# Patient Record
Sex: Male | Born: 1975 | Hispanic: Yes | Marital: Married | Smoking: Never smoker
Health system: Southern US, Community
[De-identification: ages and names within clinical notes are randomized; demographics above are authoritative.]

## PROBLEM LIST (undated history)

## (undated) DIAGNOSIS — F419 Anxiety disorder, unspecified: Secondary | ICD-10-CM

## (undated) DIAGNOSIS — J45909 Unspecified asthma, uncomplicated: Secondary | ICD-10-CM

---

## 2018-03-09 ENCOUNTER — Encounter (HOSPITAL_COMMUNITY)
Admission: EM | Disposition: A | Discharge status: Home / Self Care | Payer: Self-pay | Source: Home / Self Care | Attending: Emergency Medicine

## 2018-03-09 ENCOUNTER — Encounter (HOSPITAL_BASED_OUTPATIENT_CLINIC_OR_DEPARTMENT_OTHER): Payer: Self-pay | Admitting: Emergency Medicine

## 2018-03-09 ENCOUNTER — Emergency Department (HOSPITAL_BASED_OUTPATIENT_CLINIC_OR_DEPARTMENT_OTHER): Payer: BLUE CROSS/BLUE SHIELD

## 2018-03-09 ENCOUNTER — Emergency Department (HOSPITAL_COMMUNITY): Payer: BLUE CROSS/BLUE SHIELD | Admitting: Anesthesiology

## 2018-03-09 ENCOUNTER — Other Ambulatory Visit: Payer: Self-pay

## 2018-03-09 ENCOUNTER — Observation Stay (HOSPITAL_BASED_OUTPATIENT_CLINIC_OR_DEPARTMENT_OTHER)
Admission: EM | Admit: 2018-03-09 | Discharge: 2018-03-10 | Disposition: A | Discharge status: Home / Self Care | Payer: BLUE CROSS/BLUE SHIELD | Attending: Surgery | Admitting: Surgery

## 2018-03-09 DIAGNOSIS — J45909 Unspecified asthma, uncomplicated: Secondary | ICD-10-CM | POA: Insufficient documentation

## 2018-03-09 DIAGNOSIS — F419 Anxiety disorder, unspecified: Secondary | ICD-10-CM | POA: Diagnosis not present

## 2018-03-09 DIAGNOSIS — K3580 Unspecified acute appendicitis: Principal | ICD-10-CM | POA: Insufficient documentation

## 2018-03-09 DIAGNOSIS — Z79899 Other long term (current) drug therapy: Secondary | ICD-10-CM | POA: Insufficient documentation

## 2018-03-09 DIAGNOSIS — K37 Unspecified appendicitis: Secondary | ICD-10-CM | POA: Diagnosis present

## 2018-03-09 HISTORY — DX: Anxiety disorder, unspecified: F41.9

## 2018-03-09 HISTORY — DX: Unspecified asthma, uncomplicated: J45.909

## 2018-03-09 HISTORY — PX: LAPAROSCOPIC APPENDECTOMY: SHX408

## 2018-03-09 LAB — CBC WITH DIFFERENTIAL/PLATELET
Abs Immature Granulocytes: 0.04 10*3/uL (ref 0.00–0.07)
Basophils Absolute: 0 10*3/uL (ref 0.0–0.1)
Basophils Relative: 0 %
Eosinophils Absolute: 0.1 10*3/uL (ref 0.0–0.5)
Eosinophils Relative: 0 %
HCT: 46.4 % (ref 39.0–52.0)
Hemoglobin: 16.1 g/dL (ref 13.0–17.0)
Immature Granulocytes: 0 %
LYMPHS ABS: 1.1 10*3/uL (ref 0.7–4.0)
Lymphocytes Relative: 7 %
MCH: 30.4 pg (ref 26.0–34.0)
MCHC: 34.7 g/dL (ref 30.0–36.0)
MCV: 87.5 fL (ref 80.0–100.0)
MONO ABS: 1.1 10*3/uL — AB (ref 0.1–1.0)
Monocytes Relative: 7 %
Neutro Abs: 14 10*3/uL — ABNORMAL HIGH (ref 1.7–7.7)
Neutrophils Relative %: 86 %
Platelets: 223 10*3/uL (ref 150–400)
RBC: 5.3 MIL/uL (ref 4.22–5.81)
RDW: 11.9 % (ref 11.5–15.5)
WBC: 16.3 10*3/uL — ABNORMAL HIGH (ref 4.0–10.5)
nRBC: 0 % (ref 0.0–0.2)

## 2018-03-09 LAB — COMPREHENSIVE METABOLIC PANEL
ALT: 25 U/L (ref 0–44)
AST: 28 U/L (ref 15–41)
Albumin: 4.4 g/dL (ref 3.5–5.0)
Alkaline Phosphatase: 67 U/L (ref 38–126)
Anion gap: 10 (ref 5–15)
BUN: 20 mg/dL (ref 6–20)
CHLORIDE: 104 mmol/L (ref 98–111)
CO2: 22 mmol/L (ref 22–32)
Calcium: 9.4 mg/dL (ref 8.9–10.3)
Creatinine, Ser: 0.9 mg/dL (ref 0.61–1.24)
GFR calc Af Amer: >60 mL/min (ref >60)
GFR calc non Af Amer: >60 mL/min (ref >60)
Glucose, Bld: 116 mg/dL — ABNORMAL HIGH (ref 70–99)
Potassium: 3.8 mmol/L (ref 3.5–5.1)
Sodium: 136 mmol/L (ref 135–145)
Total Bilirubin: 0.9 mg/dL (ref 0.3–1.2)
Total Protein: 7.5 g/dL (ref 6.5–8.1)

## 2018-03-09 LAB — URINALYSIS, ROUTINE W REFLEX MICROSCOPIC
Bilirubin Urine: NEGATIVE
Glucose, UA: NEGATIVE mg/dL
Hgb urine dipstick: NEGATIVE
Ketones, ur: NEGATIVE mg/dL
Leukocytes, UA: NEGATIVE
Nitrite: NEGATIVE
Protein, ur: NEGATIVE mg/dL
Specific Gravity, Urine: <1.005 — ABNORMAL LOW (ref 1.005–1.030)
pH: 6.5 (ref 5.0–8.0)

## 2018-03-09 LAB — LIPASE, BLOOD: Lipase: 40 U/L (ref 11–51)

## 2018-03-09 SURGERY — APPENDECTOMY, LAPAROSCOPIC
Anesthesia: General | Site: Abdomen

## 2018-03-09 MED ORDER — OXYCODONE HCL 5 MG PO TABS: 5.0000 mg | ORAL_TABLET | Freq: Once | ORAL | Status: DC | PRN

## 2018-03-09 MED ORDER — 0.9 % SODIUM CHLORIDE (POUR BTL) OPTIME
TOPICAL | Status: DC | PRN
  Administered 2018-03-09: 1000 mL

## 2018-03-09 MED ORDER — LIDOCAINE 2% (20 MG/ML) 5 ML SYRINGE
INTRAMUSCULAR | Status: DC | PRN
  Administered 2018-03-09: 80 mg via INTRAVENOUS

## 2018-03-09 MED ORDER — ONDANSETRON HCL 4 MG/2ML IJ SOLN: 4.0000 mg | Freq: Once | INTRAMUSCULAR | Status: DC | PRN

## 2018-03-09 MED ORDER — PAROXETINE HCL 20 MG PO TABS
30.0000 mg | ORAL_TABLET | Freq: Every day | ORAL | Status: DC
  Administered 2018-03-10: 30 mg via ORAL
  Filled 2018-03-09: qty 1

## 2018-03-09 MED ORDER — MIDAZOLAM HCL 2 MG/2ML IJ SOLN
INTRAMUSCULAR | Status: DC | PRN
  Administered 2018-03-09: 2 mg via INTRAVENOUS

## 2018-03-09 MED ORDER — ONDANSETRON HCL 4 MG/2ML IJ SOLN
INTRAMUSCULAR | Status: DC | PRN
  Administered 2018-03-09: 4 mg via INTRAVENOUS

## 2018-03-09 MED ORDER — ACETAMINOPHEN 325 MG PO TABS
650.0000 mg | ORAL_TABLET | Freq: Once | ORAL | Status: AC
  Administered 2018-03-09: 650 mg via ORAL
  Filled 2018-03-09: qty 2

## 2018-03-09 MED ORDER — ENOXAPARIN SODIUM 40 MG/0.4ML ~~LOC~~ SOLN
40.0000 mg | SUBCUTANEOUS | Status: DC
  Administered 2018-03-10: 40 mg via SUBCUTANEOUS
  Filled 2018-03-09: qty 0.4

## 2018-03-09 MED ORDER — TRAMADOL HCL 50 MG PO TABS: 50.0000 mg | ORAL_TABLET | Freq: Four times a day (QID) | ORAL | Status: DC | PRN

## 2018-03-09 MED ORDER — OXYCODONE HCL 5 MG/5ML PO SOLN
5.0000 mg | Freq: Once | ORAL | Status: DC | PRN
  Filled 2018-03-09: qty 5

## 2018-03-09 MED ORDER — IBUPROFEN 200 MG PO TABS: 600.0000 mg | ORAL_TABLET | Freq: Four times a day (QID) | ORAL | Status: DC | PRN

## 2018-03-09 MED ORDER — METRONIDAZOLE IN NACL 5-0.79 MG/ML-% IV SOLN
500.0000 mg | Freq: Three times a day (TID) | INTRAVENOUS | Status: DC
  Administered 2018-03-09 – 2018-03-10 (×3): 500 mg via INTRAVENOUS
  Filled 2018-03-09 (×4): qty 100

## 2018-03-09 MED ORDER — BUPIVACAINE HCL (PF) 0.25 % IJ SOLN
INTRAMUSCULAR | Status: AC
  Filled 2018-03-09: qty 30

## 2018-03-09 MED ORDER — LACTATED RINGERS IV SOLN
INTRAVENOUS | Status: DC | PRN
  Administered 2018-03-09: 14:00:00 via INTRAVENOUS

## 2018-03-09 MED ORDER — PROPOFOL 10 MG/ML IV BOLUS
INTRAVENOUS | Status: DC | PRN
  Administered 2018-03-09: 200 mg via INTRAVENOUS

## 2018-03-09 MED ORDER — PROPOFOL 10 MG/ML IV BOLUS
INTRAVENOUS | Status: AC
  Filled 2018-03-09: qty 20

## 2018-03-09 MED ORDER — SODIUM CHLORIDE 0.9 % IV SOLN
2.0000 g | Freq: Once | INTRAVENOUS | Status: AC
  Administered 2018-03-09: 2 g via INTRAVENOUS
  Filled 2018-03-09: qty 20

## 2018-03-09 MED ORDER — LIDOCAINE 2% (20 MG/ML) 5 ML SYRINGE
INTRAMUSCULAR | Status: AC
  Filled 2018-03-09: qty 5

## 2018-03-09 MED ORDER — ROCURONIUM BROMIDE 10 MG/ML (PF) SYRINGE
PREFILLED_SYRINGE | INTRAVENOUS | Status: DC | PRN
  Administered 2018-03-09: 50 mg via INTRAVENOUS

## 2018-03-09 MED ORDER — SODIUM CHLORIDE 0.9 % IV BOLUS
1000.0000 mL | Freq: Once | INTRAVENOUS | Status: AC
  Administered 2018-03-09: 1000 mL via INTRAVENOUS

## 2018-03-09 MED ORDER — ONDANSETRON 4 MG PO TBDP: 4.0000 mg | ORAL_TABLET | Freq: Four times a day (QID) | ORAL | Status: DC | PRN

## 2018-03-09 MED ORDER — ONDANSETRON HCL 4 MG/2ML IJ SOLN: 4.0000 mg | Freq: Four times a day (QID) | INTRAMUSCULAR | Status: DC | PRN

## 2018-03-09 MED ORDER — FENTANYL CITRATE (PF) 100 MCG/2ML IJ SOLN: 25.0000 ug | INTRAMUSCULAR | Status: DC | PRN

## 2018-03-09 MED ORDER — MORPHINE SULFATE (PF) 2 MG/ML IV SOLN
1.0000 mg | INTRAVENOUS | Status: DC | PRN
  Administered 2018-03-09: 2 mg via INTRAVENOUS
  Filled 2018-03-09: qty 1

## 2018-03-09 MED ORDER — ACETAMINOPHEN 160 MG/5ML PO SOLN: 325.0000 mg | ORAL | Status: DC | PRN

## 2018-03-09 MED ORDER — MEPERIDINE HCL 50 MG/ML IJ SOLN: 6.2500 mg | INTRAMUSCULAR | Status: DC | PRN

## 2018-03-09 MED ORDER — EPHEDRINE SULFATE-NACL 50-0.9 MG/10ML-% IV SOSY
PREFILLED_SYRINGE | INTRAVENOUS | Status: DC | PRN
  Administered 2018-03-09: 5 mg via INTRAVENOUS

## 2018-03-09 MED ORDER — ROCURONIUM BROMIDE 100 MG/10ML IV SOLN
INTRAVENOUS | Status: AC
  Filled 2018-03-09: qty 1

## 2018-03-09 MED ORDER — SUGAMMADEX SODIUM 200 MG/2ML IV SOLN
INTRAVENOUS | Status: DC | PRN
  Administered 2018-03-09: 150 mg via INTRAVENOUS

## 2018-03-09 MED ORDER — SODIUM CHLORIDE 0.9 % IV SOLN
2.0000 g | INTRAVENOUS | Status: DC
  Administered 2018-03-10: 2 g via INTRAVENOUS
  Filled 2018-03-09: qty 2

## 2018-03-09 MED ORDER — DEXAMETHASONE SODIUM PHOSPHATE 10 MG/ML IJ SOLN
INTRAMUSCULAR | Status: DC | PRN
  Administered 2018-03-09: 4 mg via INTRAVENOUS

## 2018-03-09 MED ORDER — LACTATED RINGERS IR SOLN
Status: DC | PRN
  Administered 2018-03-09: 1000 mL

## 2018-03-09 MED ORDER — SODIUM CHLORIDE 0.9 % IV SOLN
INTRAVENOUS | Status: DC | PRN
  Administered 2018-03-09: 09:00:00 via INTRAVENOUS

## 2018-03-09 MED ORDER — BUPIVACAINE HCL 0.25 % IJ SOLN
INTRAMUSCULAR | Status: DC | PRN
  Administered 2018-03-09: 30 mL

## 2018-03-09 MED ORDER — METRONIDAZOLE IN NACL 5-0.79 MG/ML-% IV SOLN
500.0000 mg | Freq: Once | INTRAVENOUS | Status: AC
  Administered 2018-03-09: 500 mg via INTRAVENOUS
  Filled 2018-03-09: qty 100

## 2018-03-09 MED ORDER — HYDROCODONE-ACETAMINOPHEN 5-325 MG PO TABS
1.0000 | ORAL_TABLET | ORAL | Status: DC | PRN
  Administered 2018-03-09 – 2018-03-10 (×2): 2 via ORAL
  Filled 2018-03-09 (×2): qty 2

## 2018-03-09 MED ORDER — KCL IN DEXTROSE-NACL 20-5-0.9 MEQ/L-%-% IV SOLN
INTRAVENOUS | Status: DC
  Administered 2018-03-09: 19:00:00 via INTRAVENOUS
  Filled 2018-03-09 (×2): qty 1000

## 2018-03-09 MED ORDER — FENTANYL CITRATE (PF) 250 MCG/5ML IJ SOLN
INTRAMUSCULAR | Status: AC
  Filled 2018-03-09: qty 5

## 2018-03-09 MED ORDER — IOPAMIDOL (ISOVUE-300) INJECTION 61%
100.0000 mL | Freq: Once | INTRAVENOUS | Status: AC | PRN
  Administered 2018-03-09: 100 mL via INTRAVENOUS

## 2018-03-09 MED ORDER — MIDAZOLAM HCL 2 MG/2ML IJ SOLN
INTRAMUSCULAR | Status: AC
  Filled 2018-03-09: qty 2

## 2018-03-09 MED ORDER — ACETAMINOPHEN 325 MG PO TABS: 325.0000 mg | ORAL_TABLET | ORAL | Status: DC | PRN

## 2018-03-09 MED ORDER — FENTANYL CITRATE (PF) 250 MCG/5ML IJ SOLN
INTRAMUSCULAR | Status: DC | PRN
  Administered 2018-03-09: 100 ug via INTRAVENOUS
  Administered 2018-03-09: 50 ug via INTRAVENOUS

## 2018-03-09 SURGICAL SUPPLY — 36 items
APPLIER CLIP ROT 10 11.4 M/L (STAPLE)
BENZOIN TINCTURE PRP APPL 2/3 (GAUZE/BANDAGES/DRESSINGS) IMPLANT
CABLE HIGH FREQUENCY MONO STRZ (ELECTRODE) IMPLANT
CHLORAPREP W/TINT 26ML (MISCELLANEOUS) ×3 IMPLANT
CLIP APPLIE ROT 10 11.4 M/L (STAPLE) IMPLANT
CLOSURE WOUND 1/2 X4 (GAUZE/BANDAGES/DRESSINGS)
COVER SURGICAL LIGHT HANDLE (MISCELLANEOUS) ×3 IMPLANT
COVER WAND RF STERILE (DRAPES) ×3 IMPLANT
CUTTER FLEX LINEAR 45M (STAPLE) ×3 IMPLANT
DECANTER SPIKE VIAL GLASS SM (MISCELLANEOUS) ×3 IMPLANT
DERMABOND ADVANCED (GAUZE/BANDAGES/DRESSINGS) ×2
DERMABOND ADVANCED .7 DNX12 (GAUZE/BANDAGES/DRESSINGS) ×1 IMPLANT
ELECT REM PT RETURN 15FT ADLT (MISCELLANEOUS) ×3 IMPLANT
ENDOLOOP SUT PDS II  0 18 (SUTURE)
ENDOLOOP SUT PDS II 0 18 (SUTURE) IMPLANT
GLOVE SURG SIGNA 7.5 PF LTX (GLOVE) ×3 IMPLANT
GOWN STRL REUS W/TWL XL LVL3 (GOWN DISPOSABLE) ×6 IMPLANT
KIT BASIN OR (CUSTOM PROCEDURE TRAY) ×3 IMPLANT
POUCH RETRIEVAL ECOSAC 10 (ENDOMECHANICALS) IMPLANT
POUCH RETRIEVAL ECOSAC 10MM (ENDOMECHANICALS)
POUCH SPECIMEN RETRIEVAL 10MM (ENDOMECHANICALS) IMPLANT
RELOAD 45 VASCULAR/THIN (ENDOMECHANICALS) IMPLANT
RELOAD STAPLE TA45 3.5 REG BLU (ENDOMECHANICALS) ×3 IMPLANT
SCISSORS LAP 5X35 DISP (ENDOMECHANICALS) ×3 IMPLANT
SET IRRIG TUBING LAPAROSCOPIC (IRRIGATION / IRRIGATOR) ×3 IMPLANT
SET TUBE SMOKE EVAC HIGH FLOW (TUBING) ×3 IMPLANT
SHEARS HARMONIC ACE PLUS 36CM (ENDOMECHANICALS) ×3 IMPLANT
SLEEVE XCEL OPT CAN 5 100 (ENDOMECHANICALS) ×3 IMPLANT
STRIP CLOSURE SKIN 1/2X4 (GAUZE/BANDAGES/DRESSINGS) IMPLANT
SUT MNCRL AB 4-0 PS2 18 (SUTURE) ×3 IMPLANT
SUT VIC AB 2-0 SH 18 (SUTURE) IMPLANT
TOWEL OR 17X26 10 PK STRL BLUE (TOWEL DISPOSABLE) ×3 IMPLANT
TOWEL OR NON WOVEN STRL DISP B (DISPOSABLE) ×3 IMPLANT
TRAY LAPAROSCOPIC (CUSTOM PROCEDURE TRAY) ×3 IMPLANT
TROCAR BLADELESS OPT 5 100 (ENDOMECHANICALS) ×3 IMPLANT
TROCAR XCEL BLUNT TIP 100MML (ENDOMECHANICALS) ×3 IMPLANT

## 2018-03-09 NOTE — ED Provider Notes (Signed)
MSE was initiated and I personally evaluated the patient and placed orders (if any) at  11:16 AM on March 09, 2018.  The patient appears stable so that the remainder of the MSE may be completed by another provider.  Patient transfer from [facility] after being diagnosed with appendicitis.  He is to see Dr. Ezzard Standing and his pain is controlled this time.   Lorre Nick, MD 03/09/18 1116

## 2018-03-09 NOTE — Anesthesia Postprocedure Evaluation (Signed)
Anesthesia Post Note  Patient: [name]  Procedure(s) Performed: APPENDECTOMY LAPAROSCOPIC (N/A Abdomen)     Patient location during evaluation: PACU Anesthesia Type: General Level of consciousness: awake and alert Pain management: pain level controlled Vital Signs Assessment: post-procedure vital signs reviewed and stable Respiratory status: spontaneous breathing, nonlabored ventilation, respiratory function stable and patient connected to nasal cannula oxygen Cardiovascular status: blood pressure returned to baseline and stable Postop Assessment: no apparent nausea or vomiting Anesthetic complications: no    Last Vitals:  Vitals:   03/09/18 1545 03/09/18 1600  BP: 119/79 121/81  Pulse: 64 80  Resp: 18 12  Temp:  36.7 C  SpO2: 97% 99%    Last Pain:  Vitals:   03/09/18 1600  TempSrc:   PainSc: 0-No pain                 Miku Udall

## 2018-03-09 NOTE — ED Notes (Signed)
ED Provider at bedside. 

## 2018-03-09 NOTE — ED Notes (Signed)
Patient transported to CT 

## 2018-03-09 NOTE — Anesthesia Preprocedure Evaluation (Addendum)
Anesthesia Evaluation  Patient identified by MRN, date of birth, ID band Patient awake    Reviewed: Allergy & Precautions, H&P , NPO status , Patient's Chart, lab work & pertinent test results, reviewed documented beta blocker date and time   Airway Mallampati: II  TM Distance: >3 FB Neck ROM: full    Dental no notable dental hx. (+) Teeth Intact, Dental Advisory Given BRACES:   Pulmonary asthma ,    Pulmonary exam normal breath sounds clear to auscultation       Cardiovascular Exercise Tolerance: Good negative cardio ROS   Rhythm:regular Rate:Normal     Neuro/Psych Anxiety negative neurological ROS  negative psych ROS   GI/Hepatic negative GI ROS, Neg liver ROS,   Endo/Other  negative endocrine ROS  Renal/GU negative Renal ROS  negative genitourinary   Musculoskeletal   Abdominal   Peds  Hematology negative hematology ROS (+)   Anesthesia Other Findings   Reproductive/Obstetrics negative OB ROS                            Anesthesia Physical Anesthesia Plan  ASA: II and emergent  Anesthesia Plan: General   Post-op Pain Management:    Induction: Intravenous  PONV Risk Score and Plan: 2 and Ondansetron, Treatment may vary due to age or medical condition and Dexamethasone  Airway Management Planned: Oral ETT  Additional Equipment:   Intra-op Plan:   Post-operative Plan: Extubation in OR  Informed Consent: I have reviewed the patients History and Physical, chart, labs and discussed the procedure including the risks, benefits and alternatives for the proposed anesthesia with the patient or authorized representative who has indicated his/her understanding and acceptance.     Dental Advisory Given  Plan Discussed with: CRNA, Anesthesiologist and Surgeon  Anesthesia Plan Comments: (  )        Anesthesia Quick Evaluation

## 2018-03-09 NOTE — Op Note (Signed)
Re:   John Daniels DOB:   01/11/1976 MRN:   [id number]                   [facility]  DATE OF PROCEDURE: 03/09/2018                              OPERATIVE REPORT  PREOPERATIVE DIAGNOSIS:  Appendicitis  POSTOPERATIVE DIAGNOSIS:  Acute purulent appendicitis.  PROCEDURE:  Laparoscopic appendectomy.  SURGEON:  Sandria Balesavid H. Ezzard StandingNewman, MD  ASSISTANT:  No first assistant.  ANESTHESIA:  General endotracheal.  Anesthesiologist: Bethena Midgetddono, Ernest, MD CRNA: Nelle DonPulliam, Elizabeth C, CRNA  ASA:  2E  ESTIMATED BLOOD LOSS:  Minimal.  DRAINS: none   SPECIMEN:   Appendix  COUNTS CORRECT:  YES  INDICATIONS FOR PROCEDURE: John Daniels is a 43 y.o. (DOB: 01/11/1976) Hispanic male whose primary care doctor is System, Pcp Not In and comes to the OR for an appendectomy.   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  I discussed with the patient, the indications and potential complications of appendiceal surgery.  The potential complications include, but are not limited to, bleeding, open surgery, bowel resection, and the possibility of another diagnosis.  OPERATIVE NOTE:  The patient underwent a general endotracheal anesthetic as supervised by Anesthesiologist: Bethena Midgetddono, Ernest, MD CRNA: Nelle DonPulliam, Elizabeth C, CRNA, General, in OR room #1.  The patient was given Rocephin prior to the beginning of the procedure and the abdomen was prepped with ChloraPrep.  A foley catheter was passed.  A time-out was held and surgical checklist run.  An infraumbilical incision was made with sharp dissection carried down to the abdominal cavity.  An 12 mm Hasson trocar was inserted through the infraumbilical incision and into the peritoneal cavity.  A 30 degree 5 mm laparoscope was inserted through a 12 mm Hasson trocar and the Hasson trocar secured with a 0 Vicryl suture.  I placed a 5 mm trocar in the right upper quadrant and a 5 mm torcar in left lower quadrant and did abdominal exploration.    The right and left lobes of liver  unremarkable.  Stomach was unremarkable.  The pelvic organs were unremarkable.  I saw no other intra-abdominal abnormality.  The patient had appendicitis with the appendix located in the right lower quadrant, behind the cecum.  The appendix was engorged, covered with purulence.  The appendix was not ruptured.  The mesentery of the appendix was divided with a Harmonic scalpel.  I got to the base of the appendix.  I then used a blue load 45 mm Ethicon Endo-GIA stapler and fired this across the base of the appendix.  I placed the appendix in Eco Sac bag and delivered the bag through the umbilical incision.  I irrigated the abdomen with 400 cc of saline.  After irrigating the abdomen, I then removed the trocars, in turn.  The umbilical port fascia was closed with 0 Vicryl suture.   I closed the skin each site with a 4-0 Monocryl suture and painted the wounds with DermaBond.  I then injected a total of 30 mL of 0.25% Marcaine at the incisions.  Sponge and needle count were correct at the end of the case.  The foley catheter was removed. The patient was transferred to the recovery room in good condition.  The patient tolerated the procedure well and it depends on the patient's post op clinical course as to when the patient could be  discharged.   Ovidio Kinavid Ameisha Mcclellan, MD, [facility] Pager: [phone] Office phone:  [phone]

## 2018-03-09 NOTE — ED Triage Notes (Signed)
RLQ abd pain that started during the night. Denies N/v/D.

## 2018-03-09 NOTE — Anesthesia Procedure Notes (Signed)
Procedure Name: Intubation Date/Time: 03/09/2018 1:49 PM Performed by: Niel Hummer, CRNA Pre-anesthesia Checklist: Patient being monitored, Suction available, Emergency Drugs available and Patient identified Patient Re-evaluated:Patient Re-evaluated prior to induction Oxygen Delivery Method: Circle system utilized Preoxygenation: Pre-oxygenation with 100% oxygen Induction Type: IV induction Ventilation: Mask ventilation without difficulty Laryngoscope Size: Mac and 4 Grade View: Grade I Tube type: Oral Tube size: 7.5 mm Number of attempts: 1 Airway Equipment and Method: Stylet Placement Confirmation: ETT inserted through vocal cords under direct vision,  positive ETCO2 and breath sounds checked- equal and bilateral Secured at: 22 cm Tube secured with: Tape Dental Injury: Teeth and Oropharynx as per pre-operative assessment

## 2018-03-09 NOTE — Transfer of Care (Signed)
Immediate Anesthesia Transfer of Care Note  Patient: John Daniels  Procedure(s) Performed: APPENDECTOMY LAPAROSCOPIC (N/A Abdomen)  Patient Location: PACU  Anesthesia Type:General  Level of Consciousness: awake, alert  and oriented  Airway & Oxygen Therapy: Patient Spontanous Breathing and Patient connected to face mask oxygen  Post-op Assessment: Report given to RN and Post -op Vital signs reviewed and stable  Post vital signs: Reviewed and stable  Last Vitals:  Vitals Value Taken Time  BP 119/71 03/09/2018  3:07 PM  Temp    Pulse 86 03/09/2018  3:08 PM  Resp 15 03/09/2018  3:08 PM  SpO2 100 % 03/09/2018  3:08 PM  Vitals shown include unvalidated device data.  Last Pain:  Vitals:   03/09/18 1111  TempSrc:   PainSc: 3          Complications: No apparent anesthesia complications

## 2018-03-09 NOTE — ED Provider Notes (Signed)
[facility] EMERGENCY DEPARTMENT Provider Note   CSN: [id number] Arrival date & time: 03/09/18  0709     History   Chief Complaint Chief Complaint  Patient presents with  . Abdominal Pain    HPI John Daniels is a 43 y.o. male.  HPI  43 year old male presents with right-sided abdominal pain.  Started around midnight.  Has progressively worsened since.  Laying on his right side seems to make the pain better but laying on his left side or deep breathing makes it worse.  The pain is currently rated as an 8 out of 10.  He has felt a subjective fever.  No vomiting, nausea, diarrhea or constipation.  No urinary symptoms.  No chest pain, shortness of breath or back pain.  No prior abdominal surgeries.  He notes a history of anxiety but no other past medical history.  Past Medical History:  Diagnosis Date  . Anxiety   . Asthma     There are no active problems to display for this patient.   History reviewed. No pertinent surgical history.      Home Medications    Prior to Admission medications   Medication Sig Start Date End Date Taking? Authorizing Provider  acyclovir (ZOVIRAX) 200 MG capsule Take 200 mg by mouth 5 (five) times daily.   Yes [provider]  PARoxetine (PAXIL) 30 MG tablet Take 30 mg by mouth daily.   Yes [provider]    Family History No family history on file.  Social History Social History   Tobacco Use  . Smoking status: Never Smoker  . Smokeless tobacco: Never Used  Substance Use Topics  . Alcohol use: Yes  . Drug use: Never     Allergies   Patient has no known allergies.   Review of Systems Review of Systems  Constitutional: Positive for fever.  Respiratory: Negative for shortness of breath.   Gastrointestinal: Positive for abdominal pain. Negative for constipation, diarrhea, nausea and vomiting.  Genitourinary: Negative for dysuria and hematuria.  Musculoskeletal: Negative for back pain.  All other  systems reviewed and are negative.    Physical Exam Updated Vital Signs BP (!) 136/98 (BP Location: Left Arm)   Pulse 92   Temp 100.1 F (37.8 C) (Oral)   Resp 18   Ht 5\' 7"  (1.702 m)   Wt 77.1 kg   SpO2 99%   BMI 26.63 kg/m   Physical Exam Vitals signs and nursing note reviewed.  Constitutional:      General: He is not in acute distress.    Appearance: He is well-developed. He is not ill-appearing or diaphoretic.  HENT:     Head: Normocephalic and atraumatic.     Right Ear: External ear normal.     Left Ear: External ear normal.     Nose: Nose normal.  Eyes:     General:        Right eye: No discharge.        Left eye: No discharge.  Neck:     Musculoskeletal: Neck supple.  Cardiovascular:     Rate and Rhythm: Normal rate and regular rhythm.     Heart sounds: Normal heart sounds.  Pulmonary:     Effort: Pulmonary effort is normal.     Breath sounds: Normal breath sounds.  Abdominal:     Palpations: Abdomen is soft.     Tenderness: There is abdominal tenderness in the right upper quadrant and right lower quadrant. There is no right CVA  tenderness or left CVA tenderness.       Comments: Diffuse mild-moderate pain in R abd, worst at indicator  Skin:    General: Skin is warm and dry.  Neurological:     Mental Status: He is alert.  Psychiatric:        Mood and Affect: Mood is not anxious.      ED Treatments / Results  Labs (all labs ordered are listed, but only abnormal results are displayed) Labs Reviewed  COMPREHENSIVE METABOLIC PANEL - Abnormal; Notable for the following components:      Result Value   Glucose, Bld 116 (*)    All other components within normal limits  CBC WITH DIFFERENTIAL/PLATELET - Abnormal; Notable for the following components:   WBC 16.3 (*)    Neutro Abs 14.0 (*)    Monocytes Absolute 1.1 (*)    All other components within normal limits  LIPASE, BLOOD  URINALYSIS, ROUTINE W REFLEX MICROSCOPIC    EKG None  Radiology Ct  Abdomen Pelvis W Contrast  Result Date: 03/09/2018 CLINICAL DATA:  Pt with RLQ pain x 7 hours; fever, diarrhea, nausea; no h/o abdominal sx; no h/o diverticulitis; no constipation; Labs w/in limits for CT contrast EXAM: CT ABDOMEN AND PELVIS WITH CONTRAST TECHNIQUE: Multidetector CT imaging of the abdomen and pelvis was performed using the standard protocol following bolus administration of intravenous contrast. CONTRAST:  100mL ISOVUE-300 IOPAMIDOL (ISOVUE-300) INJECTION 61% COMPARISON:  None FINDINGS: Lower chest: No acute abnormality. Hepatobiliary: No focal liver abnormality is seen. No radiopaque gallstones, biliary dilatation, or pericholecystic inflammatory changes. Pancreas: Unremarkable. No pancreatic ductal dilatation or surrounding inflammatory changes. Spleen: Normal in size without focal abnormality. Adrenals/Urinary Tract: Adrenal glands are normal in appearance. Ureters are unremarkable. No renal mass. The bladder and visualized portion of the urethra are normal. Stomach/Bowel: The stomach is normal in appearance. Small bowel loops contain a large amount of fecal material without dilatation. Moderate amount of stool throughout the colon. Scattered diverticula without acute diverticulitis. The appendix is thickened, enhancing, measuring 11 millimeters in diameter. There is subtle stranding in the periappendiceal region and thickening of the RIGHT paracolic peritoneal reflection. No fluid collections region around the appendix. No abscess. Vascular/Lymphatic: No significant vascular findings are present. No enlarged abdominal or pelvic lymph nodes. Reproductive: Prostatic calcifications are present. Other: No abdominal wall hernia or abnormality. No abdominopelvic ascites. Musculoskeletal: No acute or significant osseous findings. IMPRESSION: 1. Findings are consistent with acute appendicitis. No evidence for abscess or perforation. 2.  Appendix: With: Inferior to the cecum Diameter: 11 millimeters  Appendicolith: None Mucosal hyper-enhancement: Yes Extraluminal gas: None Periappendiceal collection: No 3. Large amount of stool throughout the colon and small bowel, consistent with stasis. The appearance does not favor obstruction. 4. Prostatic calcifications. Electronically Signed   By: Norva PavlovElizabeth  Brown M.D.   On: 03/09/2018 08:48    Procedures Procedures (including critical care time)  Medications Ordered in ED Medications  cefTRIAXone (ROCEPHIN) 2 g in sodium chloride 0.9 % 100 mL IVPB (has no administration in time range)    And  metroNIDAZOLE (FLAGYL) IVPB 500 mg (has no administration in time range)  0.9 %  sodium chloride infusion (has no administration in time range)  sodium chloride 0.9 % bolus 1,000 mL (0 mLs Intravenous Stopped 03/09/18 0903)  acetaminophen (TYLENOL) tablet 650 mg (650 mg Oral Given 03/09/18 0733)  iopamidol (ISOVUE-300) 61 % injection 100 mL (100 mLs Intravenous Contrast Given 03/09/18 0814)     Initial Impression / Assessment and  Plan / ED Course  I have reviewed the triage vital signs and the nursing notes.  Pertinent labs & imaging results that were available during my care of the patient were reviewed by me and considered in my medical decision making (see chart for details).     CT scan confirms acute appendicitis.  He is feeling better after Tylenol.  He will be kept n.p.o. Discussed with Dr. Ezzard Standing, who asks for patient to be sent to ED. Dr. Freida Busman, EDP made aware of patient being transferred.  Final Clinical Impressions(s) / ED Diagnoses   Final diagnoses:  Acute appendicitis without peritonitis    ED Discharge Orders    None       Pricilla Loveless, MD 03/09/18 [phone]

## 2018-03-09 NOTE — H&P (Signed)
Re:   Arrow Jensen DOB:   07-Aug-1975 MRN:   [id number]  Chief Complaint Abdominal pain  ASSESEMENT AND PLAN: 1.  Appendicitis  I discussed with the patient the indications and risks of appendiceal surgery.  The primary risks of appendiceal surgery include, but are not limited to, bleeding, infection, bowel surgery, and open surgery.  There is also the risk that the patient may have continued symptoms after surgery.  We discussed the typical post-operative recovery course. I tried to answer the patient's questions.   Chief Complaint  Patient presents with  . Abdominal Pain   PHYSICIAN REQUESTING CONSULTATION: System, Pcp Not In  HISTORY OF PRESENT ILLNESS: Terrace Pieske is a 43 y.o. (DOB: Feb 07, 1975)  Hispanic male whose primary care physician is System, Pcp Not In. He is by himself.   He lives in [place].  He works for [employer].  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  He is staying a hotel.  He developed right sided abdominal pain last night around midnight.  The pain continued and he had one of his coworkers take him to [employer] today.  His evaluation has revealed appendicitis.  He has not history of stomach, liver, pancreas, or colon disease.  He has no prior abdominal surgery.   CT scan of the abdomen - 03/09/2018 - 1. Findings are consistent with acute appendicitis. No evidence for abscess or perforation. 2.  Appendix: With: Inferior to the cecum Diameter: 11 millimeters  Appendicolith: None  Mucosal hyper-enhancement: Yes  Extraluminal gas: None  Periappendiceal collection: No 3. Large amount of stool throughout the colon and small bowel, consistent with stasis. The appearance does not favor obstruction. 4. Prostatic calcifications.  WBC - 16,300 - 03/09/2018   Past Medical History:  Diagnosis Date  . Anxiety   . Asthma      History reviewed. No pertinent surgical history.    Current Facility-Administered Medications  Medication Dose Route  Frequency Provider Last Rate Last Dose  . 0.9 %  sodium chloride infusion   Intravenous PRN Pricilla Loveless, MD 10 mL/hr at 03/09/18 0906     Current Outpatient Medications  Medication Sig Dispense Refill  . acyclovir (ZOVIRAX) 200 MG capsule Take 200 mg by mouth 5 (five) times daily.    Marland Kitchen PARoxetine (PAXIL) 30 MG tablet Take 30 mg by mouth daily.       No Known Allergies  REVIEW OF SYSTEMS: Skin:  No history of rash.  No history of abnormal moles. Infection:  No history of hepatitis or HIV.  No history of MRSA. Neurologic:  No history of stroke.  No history of seizure.  No history of headaches. Cardiac:  No history of hypertension. No history of heart disease.  No history of prior cardiac catheterization.  No history of seeing a cardiologist. Pulmonary:  Asthma as a child, but no problems recently.  Endocrine:  No diabetes. No thyroid disease. Gastrointestinal:  See HPI Urologic:  Remote kidney infection Musculoskeletal:  No history of joint or back disease. Hematologic:  No bleeding disorder.  No history of anemia.  Not anticoagulated. Psycho-social:  The patient is oriented.   The patient has no obvious psychologic or social impairment to understanding our conversation and plan.  SOCIAL and FAMILY HISTORY: Married. His wife does not speak [place].  He has two children:  23 yo and 31 yo. He works for [employer] as a Naval architect.  PHYSICAL EXAM: BP 121/81 (BP Location: Left Arm)   Pulse 71  Temp 98.3 F (36.8 C) (Oral)   Resp 18   Ht 5\' 7"  (1.702 m)   Wt 77.1 kg   SpO2 99%   BMI 26.63 kg/m   General: Hispanic male who is alert and generally healthy appearing.  Skin:  Inspection and palpation - no mass or rash. Eyes:  Conjunctiva and lids unremarkable.            Pupils are equal Ears, Nose, Mouth, and Throat:  Ears and nose unremarkable            Lips and teeth are unremarable. Neck: Supple. No mass, trachea midline.  No thyroid mass. Lymph Nodes:  No supraclavicular,  cervical, or inguinal nodes. Lungs: Normal respiratory effort.  Clear to auscultation and symmetric breath sounds. Heart:  Palpation of the heart is normal.            Auscultation: RRR. No murmur or rub.  Abdomen: Soft. No mass. No hernia. Has bowel sounds.  No abdominal scars.  He is tender in his right abdomen and laterally. Rectal: Not done. Musculoskeletal:  Normal gait.            Good muscle strength and ROM  in upper and lower extremities.  Neurologic:  Grossly intact to motor and sensory function. Psychiatric: Normal judgement and insight. Behavior is normal.            Oriented to time, person, place.   DATA REVIEWED, COUNSELING AND COORDINATION OF CARE: Epic notes reviewed. Counseling and coordination of care exceeded more than 50% of the time spent with patient. Total time spent with patient and charting: 45 mintues  Ovidio Kinavid Izaiyah Kleinman, MD,  [facility], [place] [address]  [address]   [place], [place]    [id number] Phone:  [phone] FAX:  [phone]

## 2018-03-10 ENCOUNTER — Encounter (HOSPITAL_COMMUNITY): Payer: Self-pay | Admitting: Surgery

## 2018-03-10 MED ORDER — HYDROCODONE-ACETAMINOPHEN 5-325 MG PO TABS: 1.0000 | ORAL_TABLET | ORAL | 0 refills | Status: AC | PRN

## 2018-03-10 NOTE — Discharge Instructions (Signed)
[facility]  LAPAROSCOPIC SURGERY:  POST-OP INSTRUCTIONS  Always review your discharge instruction sheet given to you by the facility where your surgery was performed.  A prescription for pain medication may be given to you upon discharge.  Take your pain medication as prescribed.  If narcotic pain medicine is not needed, then you may take acetaminophen (Tylenol) or ibuprofen (Advil) as needed.  Take your usually prescribed medications unless otherwise directed.  If you need a refill on your pain medication, please contact your pharmacy.  They will contact our office to request authorization. Prescriptions will not be filled after 5 P.M. or on weekends.  You should follow a light diet the first few days after arrival home, such as soup and crackers or toast.  Be sure to include plenty of fluids daily.  Most patients will experience some swelling and bruising in the area of the incisions.  Ice packs will help.  Swelling and bruising can take several days to resolve.   It is common to experience some constipation after surgery.  Increasing fluid intake and taking a stool softener (such as Colace) will usually help or prevent this problem from occurring.  A mild laxative (Milk of Magnesia or Miralax) should be taken according to package instructions if there has been no bowel movement after 48 hours.  You will have steri-strips and a gauze dressing over your incisions.  You may remove the gauze bandage on the second day after surgery, and you may shower at that time.  Leave your steri-strips (small skin tapes) in place directly over the incision.  These strips should remain on the skin for 5-7 days and then be removed.  You may get them wet in the shower and pat them dry.  Any sutures or staples will be removed at the office during your follow-up visit.  ACTIVITIES:  You may resume regular (light) daily activities beginning the next day - such as daily self-care, walking,  climbing stairs - gradually increasing activities as tolerated.  You may have sexual intercourse when it is comfortable.  Refrain from any heavy lifting or straining until approved by your doctor.  You may drive when you are no longer taking prescription pain medication, you can comfortably wear a seatbelt, and you can safely maneuver your car and apply brakes.  You should see your doctor in the office for a follow-up appointment approximately 2-3 weeks after your surgery.  Make sure that you call for this appointment within a day or two after you arrive home to insure a convenient appointment time.  WHEN TO CALL YOUR DOCTOR: 1. Fever over 101.0 2. Inability to urinate 3. Continued bleeding from incision 4. Increased pain, redness, or drainage from the incision 5. Increasing abdominal pain  The clinic staff is available to answer your questions during regular business hours.  Please dont hesitate to call and ask to speak to one of the nurses for clinical concerns.  If you have a medical emergency, go to the nearest emergency room or call 911.  A surgeon from [facility] is always on call for the hospital.  RETURN TO WORK / LIMITATIONS  Patient is restricted to 25 lbs lifting for 2 weeks from date of surgery (03/09/2018).  After this time period, patient may resume normal activitiy without restriction.  No driving until patient is off of all pain medication.  May shower.  Remove Dermabond in 10-14 days.    Velora Heckler, MD, [facility]  Office: [phone] Toll Free:  [phone] FAX [phone]  Website: [email or web address]

## 2018-03-10 NOTE — Discharge Summary (Signed)
Physician Discharge Summary [facility]  Patient ID: John BirkenheadJose Daniels MRN: [id number] DOB/AGE: [birth year] 43 y.o.  Admit date: 03/09/2018 Discharge date: 03/10/2018  Admission Diagnoses:  Acute appendicitis  Discharge Diagnoses:  Active Problems:   Appendicitis   Discharged Condition: good  Hospital Course: Patient was admitted for observation following laparoscopic appendectomy by Dr. Ovidio Kinavid Newman.  Post op course was uncomplicated.  Pain was well controlled.  Tolerated diet.  Patient was prepared for discharge home on POD#1.  Consults: None  Treatments: surgery: lap appendectomy  Discharge Exam: Blood pressure 115/67, pulse 70, temperature 97.7 F (36.5 C), temperature source Oral, resp. rate 16, height 5\' 7"  (1.702 m), weight 77.1 kg, SpO2 97 %. HEENT - clear Neck - soft Chest - clear bilaterally Cor - RRR Abd - soft without distension; wounds dry and intact; minimal RLQ tenderness   Disposition: Home  Discharge Instructions    Diet - low sodium heart healthy   Complete by:  As directed    Discharge instructions   Complete by:  As directed    [facility]  LAPAROSCOPIC SURGERY:  POST-OP INSTRUCTIONS  Always review your discharge instruction sheet given to you by the facility where your surgery was performed.  A prescription for pain medication may be given to you upon discharge.  Take your pain medication as prescribed.  If narcotic pain medicine is not needed, then you may take acetaminophen (Tylenol) or ibuprofen (Advil) as needed.  Take your usually prescribed medications unless otherwise directed.  If you need a refill on your pain medication, please contact your pharmacy.  They will contact our office to request authorization. Prescriptions will not be filled after 5 P.M. or on weekends.  You should follow a light diet the first few days after arrival home, such as soup and crackers or toast.  Be sure to include plenty of  fluids daily.  Most patients will experience some swelling and bruising in the area of the incisions.  Ice packs will help.  Swelling and bruising can take several days to resolve.   It is common to experience some constipation after surgery.  Increasing fluid intake and taking a stool softener (such as Colace) will usually help or prevent this problem from occurring.  A mild laxative (Milk of Magnesia or Miralax) should be taken according to package instructions if there has been no bowel movement after 48 hours.  You will have steri-strips and a gauze dressing over your incisions.  You may remove the gauze bandage on the second day after surgery, and you may shower at that time.  Leave your steri-strips (small skin tapes) in place directly over the incision.  These strips should remain on the skin for 5-7 days and then be removed.  You may get them wet in the shower and pat them dry.  Any sutures or staples will be removed at the office during your follow-up visit.  ACTIVITIES:  You may resume regular (light) daily activities beginning the next day - such as daily self-care, walking, climbing stairs - gradually increasing activities as tolerated.  You may have sexual intercourse when it is comfortable.  Refrain from any heavy lifting or straining until approved by your doctor.  You may drive when you are no longer taking prescription pain medication, you can comfortably wear a seatbelt, and you can safely maneuver your car and apply brakes.  You should see your doctor in the office for a follow-up appointment approximately 2-3 weeks  after your surgery.  Make sure that you call for this appointment within a day or two after you arrive home to insure a convenient appointment time.  WHEN TO CALL YOUR DOCTOR: Fever over 101.0 Inability to urinate Continued bleeding from incision Increased pain, redness, or drainage from the incision Increasing abdominal pain  The clinic staff is available to  answer your questions during regular business hours.  Please don't hesitate to call and ask to speak to one of the nurses for clinical concerns.  If you have a medical emergency, go to the nearest emergency room or call 911.  A surgeon from [facility] is always on call for the hospital.  Velora Heckler, MD, [facility] Office: [phone] Toll Free:  [phone] FAX [phone]  Website: [email or web address]   Increase activity slowly   Complete by:  As directed    No dressing needed   Complete by:  As directed       Follow-up Information    [facility] Schedule an appointment as soon as possible for a visit in 2 week(s).   Specialty:  General Surgery Contact information: [address] [place] [address] [phone]          Velora Heckler, MD, [facility] Office: [phone]   Signed: Darnell Level 03/10/2018, 10:18 AM

## 2018-03-10 NOTE — Plan of Care (Signed)
Plan of care reviewed and discussed with the patient. 

## 2019-07-28 IMAGING — CT CT ABD-PELV W/ CM
2 of 5 series · 15 of 46 positions shown, 17 images · IV contrast (APPLIED)
Comparison: None

CLINICAL DATA: Pt with RLQ pain x 7 hours; fever, diarrhea, nausea;
no h/o abdominal sx; no h/o diverticulitis; no constipation; Labs
w/in limits for CT contrast

EXAM:
CT ABDOMEN AND PELVIS WITH CONTRAST
TECHNIQUE: Multidetector CT imaging of the abdomen and pelvis was performed
using the standard protocol following bolus administration of
intravenous contrast.
CONTRAST:  100mL [id number] IOPAMIDOL ([id number]) INJECTION 61%

[Series 2: axial st · axial · 0.91mm/px · z∈[-459,-19]mm · 12 of 98 slices shown, 14 images]
[im 5/98  soft-tissue]
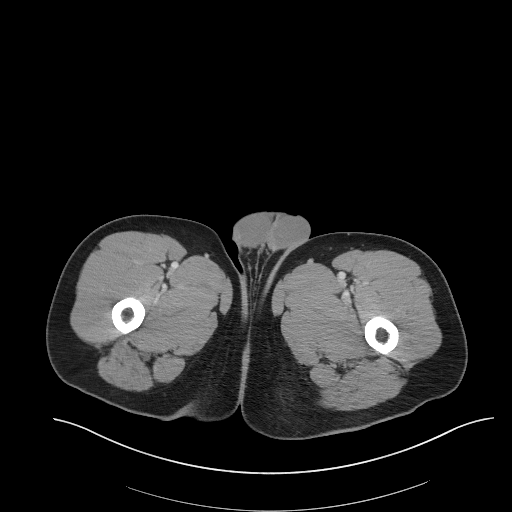
[im 5/98  bone]
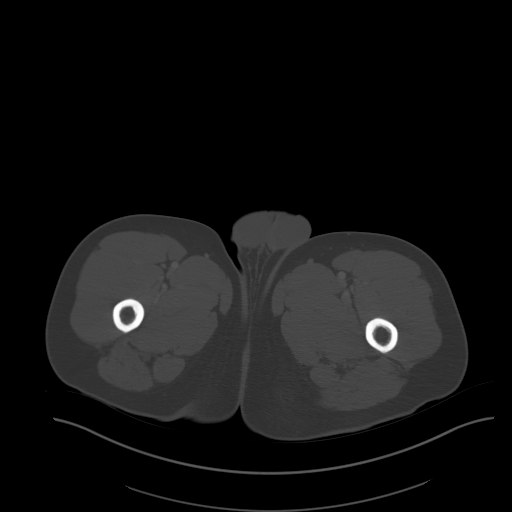
[im 14/98  soft-tissue]
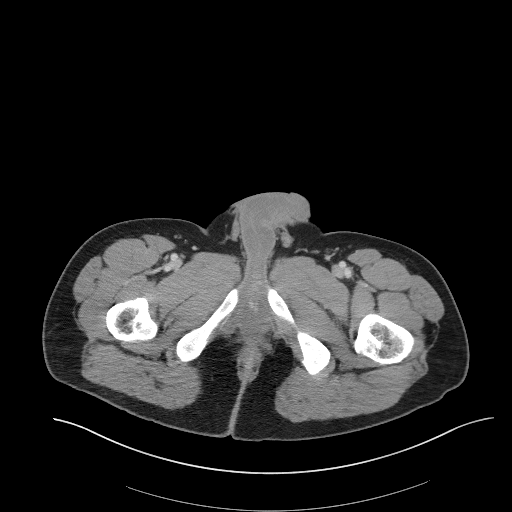
[im 24/98  soft-tissue]
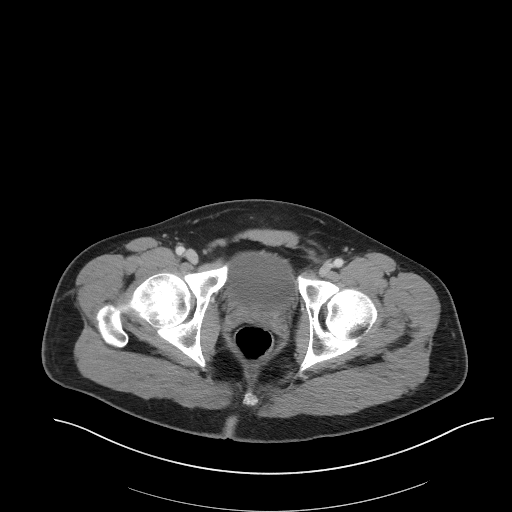
[im 28/98  soft-tissue]
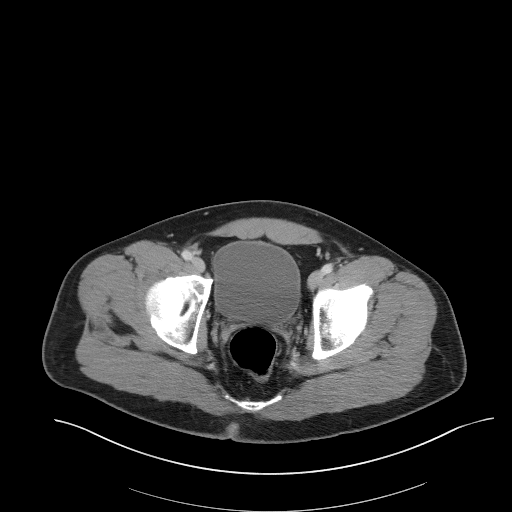
[im 37/98  soft-tissue]
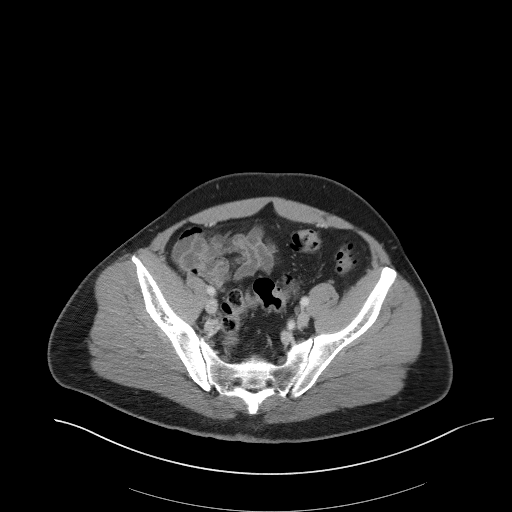
[im 47/98  soft-tissue]
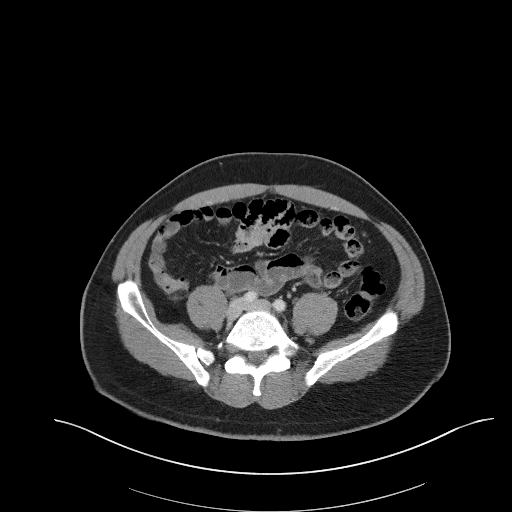
[im 51/98  soft-tissue]
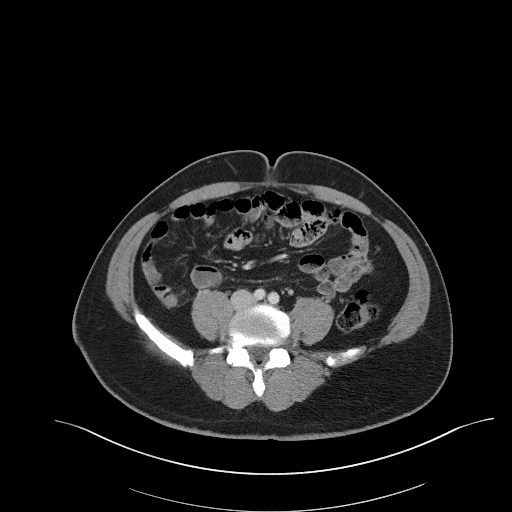
[im 61/98  soft-tissue]
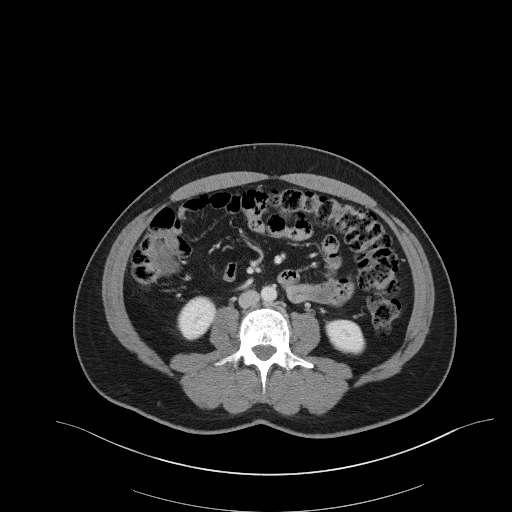
[im 70/98  soft-tissue]
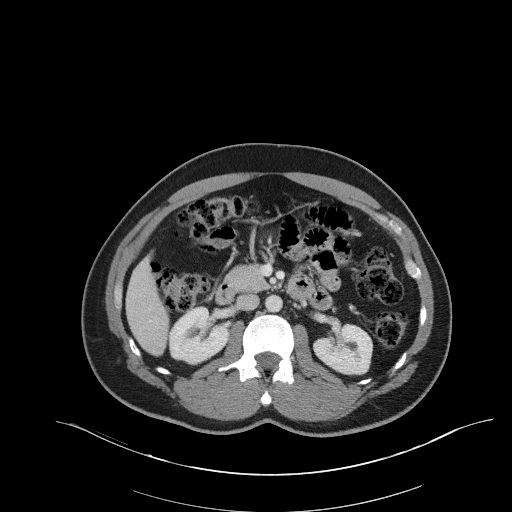
[im 70/98  bone]
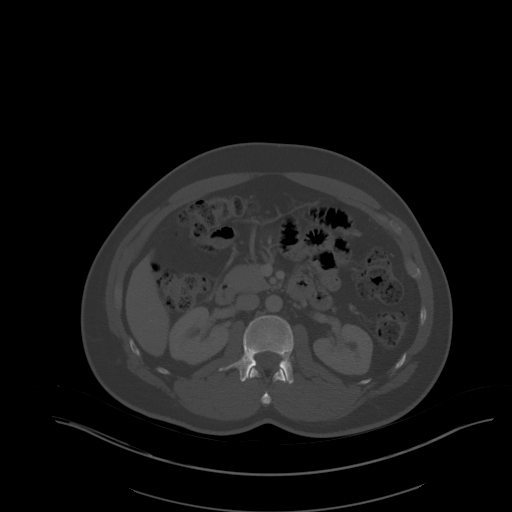
[im 74/98  soft-tissue]
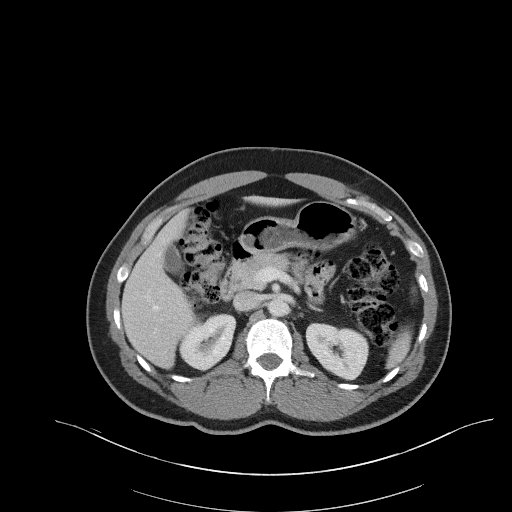
[im 84/98  soft-tissue]
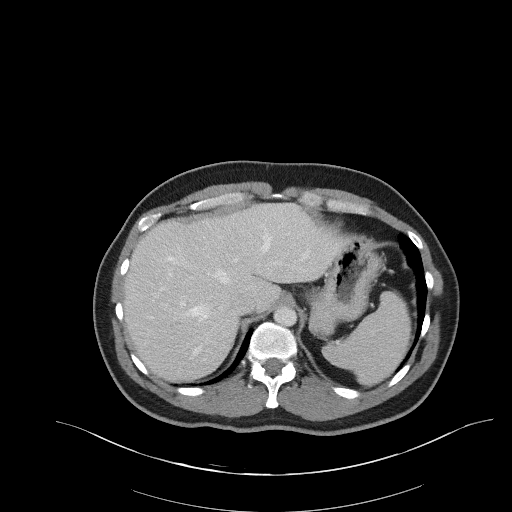
[im 93/98  soft-tissue]
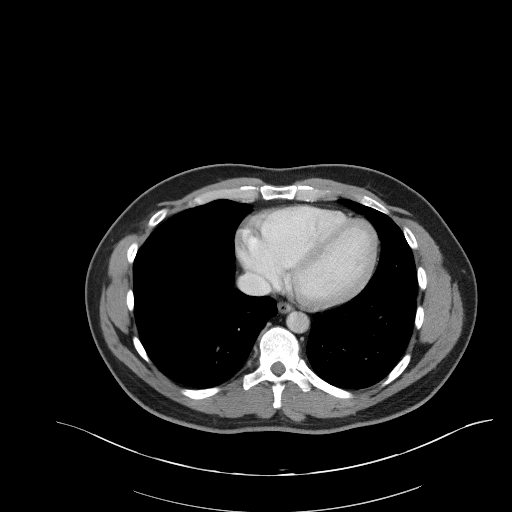

[Series 5: coronal st · coronal · 0.70mm/px · 3 of 93 slices shown]
[im 31/93  soft-tissue]
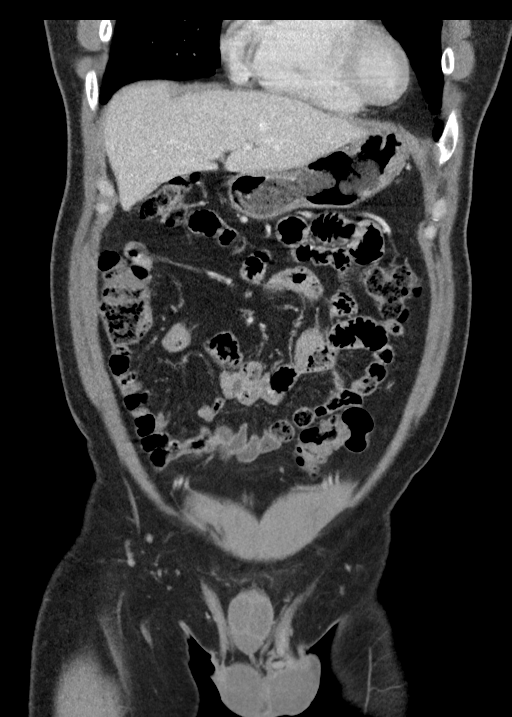
[im 41/93  soft-tissue]
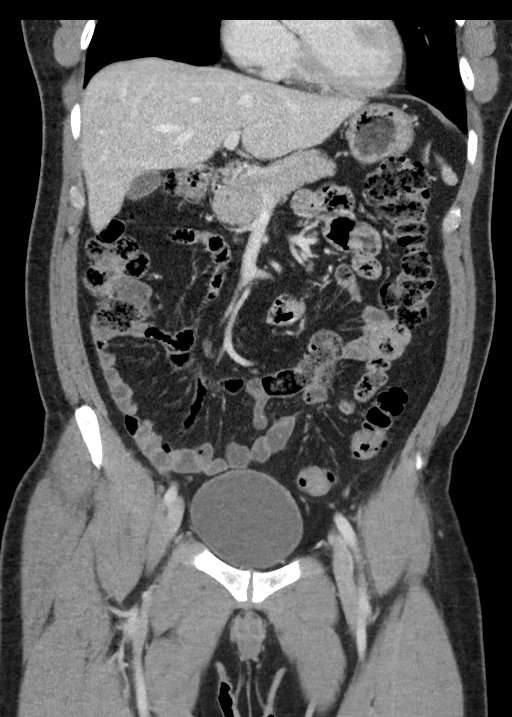
[im 52/93  soft-tissue]
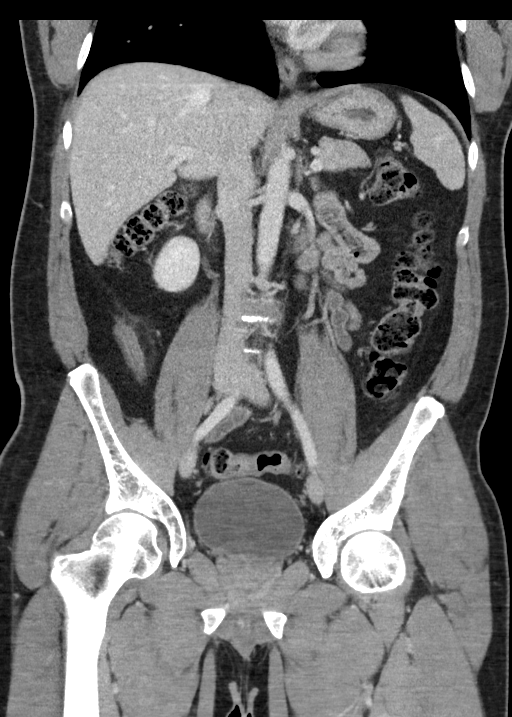

[15 of 46 positions shown; findings below may reference images not displayed]

FINDINGS: Lower chest: No acute abnormality.

Hepatobiliary: No focal liver abnormality is seen. No radiopaque
gallstones, biliary dilatation, or pericholecystic inflammatory
changes.

Pancreas: Unremarkable. No pancreatic ductal dilatation or
surrounding inflammatory changes.

Spleen: Normal in size without focal abnormality.

Adrenals/Urinary Tract: Adrenal glands are normal in appearance.
Ureters are unremarkable. No renal mass. The bladder and visualized
portion of the urethra are normal.

Stomach/Bowel: The stomach is normal in appearance. Small bowel
loops contain a large amount of fecal material without dilatation.
Moderate amount of stool throughout the colon. Scattered diverticula
without acute diverticulitis.

The appendix is thickened, enhancing, measuring 11 millimeters in
diameter. There is subtle stranding in the periappendiceal region
and thickening of the RIGHT paracolic peritoneal reflection. No
fluid collections region around the appendix. No abscess.

Vascular/Lymphatic: No significant vascular findings are present. No
enlarged abdominal or pelvic lymph nodes.

Reproductive: Prostatic calcifications are present.

Other: No abdominal wall hernia or abnormality. No abdominopelvic
ascites.

Musculoskeletal: No acute or significant osseous findings.
IMPRESSION: 1. Findings are consistent with acute appendicitis. No evidence for
abscess or perforation.
2.  Appendix: With: Inferior to the cecum
Diameter: 11 millimeters
Appendicolith: None
Mucosal hyper-enhancement: Yes
Extraluminal gas: None
Periappendiceal collection: No
3. Large amount of stool throughout the colon and small bowel,
consistent with stasis. The appearance does not favor obstruction.
4. Prostatic calcifications.
